# Patient Record
Sex: Male | Born: 2000 | Race: Black or African American | Hispanic: Refuse to answer | Marital: Single
Health system: Midwestern US, Community
[De-identification: ages and names within clinical notes are randomized; demographics above are authoritative.]

## PROBLEM LIST (undated history)

## (undated) DIAGNOSIS — M542 Cervicalgia: Principal | ICD-10-CM

---
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
## 2022-11-12 ENCOUNTER — Emergency Department: Admit: 2022-11-12 | Payer: MEDICAID

## 2022-11-12 ENCOUNTER — Inpatient Hospital Stay: Admit: 2022-11-12 | Discharge: 2022-11-12 | Disposition: A | Discharge status: Home / Self Care | Payer: MEDICAID

## 2022-11-12 DIAGNOSIS — M25552 Pain in left hip: Secondary | ICD-10-CM

## 2022-11-12 MED ORDER — ACETAMINOPHEN 500 MG PO TABS
500 | ORAL_TABLET | Freq: Four times a day (QID) | ORAL | 0 refills | Status: AC | PRN
Start: 2022-11-12 — End: 2022-11-19

## 2022-11-12 NOTE — ED Triage Notes (Addendum)
Pt in ED with c/o left hip pain onset 4-5 days ago. No injury noted

## 2022-11-12 NOTE — Discharge Instructions (Addendum)
Take medication as prescribed. Follow-up with your primary care physician within 2 days for reassessment. Bring the results from this visit with you for their review. Return to the ED immediately for any new, worsening, or persistent symptoms.

## 2022-11-12 NOTE — ED Provider Notes (Signed)
EMERGENCY DEPARTMENT HISTORY AND PHYSICAL EXAM    12:36 PM      Date: 11/12/2022  Patient Name: Nathan Reese    History of Presenting Illness     Chief Complaint   Patient presents with    Hip Pain         History Provided By: Patient    Additional History (Context): Nathan Reese is a 22 y.o. male with No past medical history on file.} who presents with complaint of left lateral hip pain x 4-5 days. Pt notes the pain is worse with palpation and movement. No medication PTA.  Patient denies fever or chills, swelling or discoloration, fall or trauma, numbness or tingling.    PCP: No primary care provider on file.    No current facility-administered medications for this encounter.     Current Outpatient Medications   Medication Sig Dispense Refill    acetaminophen (TYLENOL) 500 MG tablet Take 2 tablets by mouth every 6 hours as needed for Pain 56 tablet 0       Past History     Past Medical History:  No past medical history on file.    Past Surgical History:  No past surgical history on file.    Family History:  No family history on file.    Social History:       Allergies:  No Known Allergies      Review of Systems       Review of Systems   Constitutional:  Negative for chills and fever.   Respiratory:  Negative for shortness of breath.    Cardiovascular:  Negative for chest pain.   Gastrointestinal:  Negative for abdominal pain, diarrhea, nausea and vomiting.   Musculoskeletal:  Positive for arthralgias and myalgias.   Skin:  Negative for rash.   All other systems reviewed and are negative.        Physical Exam   BP 126/65   Pulse 70   Temp 98 F (36.7 C) (Oral)   Resp 18   Ht 1.727 m (5\' 8" )   Wt 59 kg (130 lb)   SpO2 99%   BMI 19.77 kg/m       Physical Exam  Vitals and nursing note reviewed.   Constitutional:       General: He is not in acute distress.     Appearance: Normal appearance. He is not ill-appearing, toxic-appearing or diaphoretic.   HENT:      Head: Normocephalic and atraumatic.    Cardiovascular:      Rate and Rhythm: Normal rate and regular rhythm.   Pulmonary:      Effort: Pulmonary effort is normal.      Breath sounds: Normal breath sounds.   Musculoskeletal:         General: Normal range of motion.      Cervical back: Normal range of motion and neck supple.      Left hip: Bony tenderness (left lateral hip TTP) present.      Comments: No erythema/edema/discoloration, full ROM and strength of hip, ambulatory with steady gait    Skin:     General: Skin is warm.      Findings: No rash.   Neurological:      General: No focal deficit present.      Mental Status: He is alert and oriented to person, place, and time.      Cranial Nerves: No cranial nerve deficit.      Sensory: No sensory deficit.  Motor: No weakness.           Diagnostic Study Results     Labs -  No results found for this or any previous visit (from the past 12 hour(s)).    Radiologic Studies -   XR HIP 2-3 VW W PELVIS LEFT   Final Result   No diagnostic abnormality.            Medical Decision Making   I am the first provider for this patient.    I reviewed the vital signs, available nursing notes, past medical history, past surgical history, family history and social history.    Vital Signs-Reviewed the patient's vital signs.    Records Reviewed: Nursing Notes and Old Medical Records (Time of Review: 12:36 PM)    ED Course: Progress Notes, Reevaluation, and Consults:  12:08 PM:  Reviewed results and plan with patient. Discussed need for close outpatient follow-up this week for reassessment. Discussed strict return precautions, including leg swelling, discoloration, or any other medical concerns. Pt in agreement with plan.     Provider Notes (Medical Decision Making): 22 year old male who presents the ED due to left lateral hip pain x 4 to 5 days.  Patient is afebrile, nontoxic-appearing, looks well.  No evidence of cellulitis, septic joint, trauma.  Ambulatory with steady gait.  Do not feel further labs or imaging are  warranted in the ED today.  Patient stable for discharge with symptomatic management and close outpatient follow-up for further assessment.  Strict return precautions provided      Diagnosis     Clinical Impression:   1. Left hip pain        Disposition: home      [facility] EMERGENCY DEPT  [address]  [place] [address]  [phone]    If symptoms worsen    [facility] MEDICINE  [address]  [place] [address]  [phone]  Schedule an appointment as soon as possible for a visit             Medication List        START taking these medications      acetaminophen 500 MG tablet  Commonly known as: TYLENOL  Take 2 tablets by mouth every 6 hours as needed for Pain               Where to Get Your Medications        These medications were sent to [facility] [address], [place] - [address] - [place] [phone] Carmon Ginsberg [phone]  [address] [place] [address]      Phone: [phone]   acetaminophen 500 MG tablet          Dictation disclaimer:  Please note that this dictation was completed with Dragon, the computer voice recognition software.  Quite often unanticipated grammatical, syntax, homophones, and other interpretive errors are inadvertently transcribed by the computer software.  Please disregard these errors.  Please excuse any errors that have escaped final proofreading.         Silvio Pate, [place]  11/12/22 [phone]

## 2024-03-08 ENCOUNTER — Inpatient Hospital Stay: Admit: 2024-03-08 | Payer: Medicaid (Managed Care)

## 2024-03-08 DIAGNOSIS — M549 Dorsalgia, unspecified: Principal | ICD-10-CM

## 2024-03-08 NOTE — Progress Notes (Signed)
 [facility] AT [facility]  [address] [place], [place] [address] [phone] Fx: [phone]  Plan of Care / Statement of Necessity for Physical Therapy Services     Patient Name: Nathan Reese DOB: [birth year]   Medical   Diagnosis: Upper back pain  Cervicalgia Treatment Diagnosis: M54.2  NECK PAIN and M54.6  THORACIC PAIN   Onset Date: 2024 Payor: [place] / Plan: ANTHEM [place] / Product Type: *No Product type* /    Referral Source: Orville Ollis ONEIDA * Start of Care [facility]): 03/08/2024   Prior Hospitalization: See medical history Provider #: [phone]   Prior Level of Function: Independent and pain free   Comorbidities: Social determinants of health: Depression   Medications: Verified on Patient Summary List     Assessment / key information:  Patient is a 23 y.o. male presenting with CC chronic neck and thoracic pain on the left side. Symptoms are insidious onset in nature x1 year, worsening in the past 2 months. Patient states it may be due to his times fishing and fighting to catch a particularly larger fish. He now has difficulty sitting/being still for prolonged periods of time, especially when using his computer. He also reports sleep disturbance due to neck pain, as well as pain/discomfort when driving. Objective measures are as follows:      Cervical ROM Loss Degrees   Flexion 40   Extension 35   Lateral Flexion R 30   Lateral Flexion L 30   Rotation R 75   Rotation L 57      Pt  will benefit from physical therapist management to address his impairments (listed below),  educate him, and improve his level of function. Thanks for your referral.    Evaluation Complexity:  History:  MEDIUM  Complexity : 1-2 comorbidities / personal factors will impact the outcome/ POC ; Examination:  LOW Complexity : 1-2 Standardized tests and measures addressing body structure, function, activity limitation and / or participation in  recreation  ;Presentation:  LOW Complexity : Stable, uncomplicated  ; Clinical Decision Making: Neck Disability Index (NDI) = 34 % ; (30% - 48% Moderate Disability) = MODERATE Complexity  Overall Complexity Rating: LOW   Problem List: pain affecting function, decrease ROM, decrease ADL/functional abilities, decrease activity tolerance, decrease flexibility/joint mobility, and decrease transfer abilities    Treatment Plan may include any combination of the following: 02889 Therapeutic Exercise, 97112 Neuromuscular Re-Education, 97140 Manual Therapy, 97530 Therapeutic Activity, 97535 Self Care/Home Management, 97014 Electrical Stim unattended / [id number] [facility]), and [id number] Mechanical Traction  Patient / Family readiness to learn indicated by: asking questions, trying to perform skills, interest, return verbalization , and return demonstration   Persons(s) to be included in education: patient (P)  Barriers to Learning/Limitations: none  Measures taken if barriers to learning present: n/a  Patient Goal (s): Less pain  Patient Self Reported Health Status: fair  Rehabilitation Potential: excellent    Short Term Goals: To be accomplished in 4 treatments  Patient to be adherent to HEP to facilitate pain control with ADL's.  -Status at IE- HEP initiated.  2.   Patient to demonstrate cervcal AROM rotation left to > 65 degrees to facilitate ease and safety in driving.  -Status at IE- C/S AROM rotation left = 57 degrees with pain.   3.   Patient to report > 70% improvement in sleep interrupted by neck pain.  -Status at IE- sleep disturbance due  to neck pain.     Long Term Goals: To be accomplished in 8 treatments  Patient to be Safe and Independent with HEP to self-manage/prevent symptoms after DC.  Patient to improve NDI score to < 24% indicate improved functional status and independence.  -Status at IE- NDI score = 34%  3.  Patient to report > 70% improvement in tolerance towards using his computer  -Status at IE- symptoms  worsened by use of computer.     Frequency / Duration: Patient to be seen 1-2 times per week for 8 treatments . Goals will be assigned and reassessed every 10 visits/ 30 days per clinic guidelines.    Patient/ Caregiver education and instruction: Diagnosis, prognosis, activity modification and exercises [x]   Plan of care has been reviewed with PTA.We reviewed our facility's Patient Personal Responsibilities (PPR) form, particularly in regards to compliance towards his appointment time, our attendance policy, and his home exercise program. Patient was informed of possible discharge for non-compliance to our attendance policy per PPR form.We also discussed his POC as deemed appropriate by the treating therapist and physician. Patient verbalized understanding that he must show objective and functional improvement in an appropriate time frame. Patient verbalized understanding that should progress or compliance be lacking, we will contact the referring physician for further consultation to address and attempt to establish alternate treatment strategies as necessary and/or possibly discharge.    Certification Period: 03/08/2024 - 05/07/24  use for Medicare Cert.or Medicaid (primary or secondary) tracking    Kellis Mcadam BJ Keath Matera, DPT, Cert. MDT, Cert. DN, Cert. SMT, Dip. Osteopractic       03/08/2024       1:51 PM  ===================================================================  I certify that the above Therapy Services are being furnished while the patient is under my care. I agree with the treatment plan and certify that this therapy is necessary.    Physician's Signature:_________________________   DATE:_________   TIME:________                           Orville Ollis DASEN, *    ** Signature, Date and Time must be completed for valid certification **  Please sign and return to [facility] or you may fax the signed copy to [phone].  Thank you.

## 2024-03-08 NOTE — Progress Notes (Signed)
 PT DAILY TREATMENT/CERVICAL EVALUATION    Patient Name: Nathan Reese    Date: 03/08/2024    DOB: 08/27/2000  Insurance: Pay[place]: [place] / Plan: ANTHEM [place] [place]ERS [place] / Product Type: *No Product type* /      Patient DOB verified yes     Visit #   Current / Total 1 8   Time   In / Out 150 243   Pain   In / Out 6 3   Subjective Functional Status/Changes: See Below   Changes to:  Meds, Allergies, Med Hx, Sx Hx?  If yes, update Summary List no  - See Chart     Treatment Area: Dorsalgia, unspecified [M54.9]    SUBJECTIVE  Mechanism of Injury/Symptoms at Onset: []  Neck []  Arm []  Forearm [x]  Thoracic   Initially started as left periscapular/T/S pain. C/S and shoulder started hurting about 6 months ago. Worsened further 2 months ago. May be due to increased activities (fishing).     Current symptoms/Complaints: Right C/S and upper trap tension/soreness, pressure to T/S   [] Improving since onset  [x] Worsening since onset  [] No change since onset    Constant Symptoms: []  Neck []  Arm ([]  L[]  R) []  Forearm ([]  L[]  R)  []  Head   -Pertinent Details: throb in upper back  Intermittent Symptoms: [x]  Neck []  Arm([]  L[]  R)  []  Forearm([]  L[]  R)  []  Head   -Pertinent Details:C/S pain, especially when looking towards the right.     Symptoms:  Aggravated by:   [x]  Bending []  Sitting [x]  Turning right  [x]  Tilting R  []  Reaching     []  Lifting   []  Carrying   []  Moving [x]  When Still  []  Cough []  Sneeze []  Valsalva         []  AM  []  As the Day Progresses      []  PM  Lying:  []  sup   []  pro   []  sidelying   [x]  Other: massage gun worsened     Eased by:    []  Bending []  Sitting []  Turning []  Tilting         []  Reaching  []  When Still  []  Moving []  AM  []  As the Day Progresses   []  PM Lying: []  sup  []  pro  []  sidelying   [x]  Other: naproxen, rubbing neck    Continued use makes the pain:  []  Better [x]   Worse []   No effect     Pain at rest: []  No    []  Yes       Disturbed night?: []  No    [x]  Yes    Sleep Position: side  sleeper, now unable to sleep on right.  Pillows: 2 under head    PMHx/Surgical Hx/Spinal Hx: unremarkable  []  Cancer  []  Trauma   []  Recent/Relevant Surgery    Previous Treatment/Compliance: unremarkable    Diagnostic Tests: []  Lab work []  X-rays    []  CT []  MRI     []  Other:  Results: no testing    Work Demands Hx: sitting    Living Situation/Domestic Life: lives with family    Mobility: independent    Self Care: independent    Leisure Activity/Recreational Limitations: exercise, computer gaming, 2 monitors. Spends 5+ hours at a time.   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 []  Yes    []  No  []  Yes []  No Recent weight change (If yes, due to dieting? []  Yes  []  No)   []  Yes []  No Persistent cough  []  Yes []  No Unremitting pain at night  []  Yes []  No Dizziness  []  Yes []  No Blurred vision  []  Yes []  No Hands more cold or painful in cold weather  []  Yes []  No Ringing in ears  []  Yes []  No Difficulty swallowing  []  Yes []  No Dysfunction of bowel or bladder  []  Yes []  No Recent illness within past 3 weeks (i.e, cold, flu)  []  Yes []  No Jaw pain    OBJECTIVE/EXAMINATION    [x]   Patient Education billed concurrently with other procedures       Posture: []  WNL  Head Position:  Shoulder/Scapular Position:  C-Kyphosis:  []  increased   []  decreased   C-Lordosis:   []  increased   []  decreased  T-Kyphosis:  []  increased   []  decreased  T-Lordosis:   []  increased   []  decreased     TMJ: []  N/A []  Abnormal - ROM:   Palpation:    Shoulder/Scapular Screen: []  WNL    []  Abnormal:    Active Movements: []  N/A   []  Too acute   []  Other:  Cervical ROM Loss Degrees Symptoms   Protrusion     Flexion 40    Retraction     Extension 35    Lateral Flexion R 30     Lateral Flexion L 30    Rotation R 75    Rotation L 57 Left C/S     Repeated Movements   Effects on present pain: produces (PR), abolishes (A), increases (incr), decreases (decr), centralizes (C), peripheral (PH), no effect (NE), Lateral Flexion (LF), Rotation (ROT)  Repeated (Rep), Retraction (RET), Protrusion (PRO), Extension (EXT)   Pre-Test Sx Symptoms During Symptoms After Testing Increase   ROM Decrease ROM No Effect   SITTING         PRO         Rep PRO         RET C/S  Left Shoulder  Left T/S Decrease Decreased left UT/Shoulder and periscapular region      Rep RET  Decrease Decreased left UT/Shoulder and periscapular region      RET EXT         Rep RET EXT           Thoracic Spine: []  N/A    []  WNL   []  Other:    Palpation:  []  Min  [x]  Mod  []  Severe    Location: left C/S  []  Min  []  Mod  []  Severe    Location: Left UT  []  Min  []  Mod  []  Severe    Location: Left T/S    Neurological (upper):   Myotome Level Muscle Test Nerve Reflex Sensation   C5 Deltoid (C5&C6) Axillary N/A Acromion to Lateral Epicondyle    Biceps (C5&C6) Musculocutaneous Biceps Tendon Purest patch at the axillary nerve at the middle deltoid    Rhomboid C5 Dorsal Scapular      Supraspinatus & Infraspinatus (C5&C6) Suprascapular      Teres Minor (C5&C6) Axillary     C6 Wrist Extensor Group (C6&C7) Radial Brachioradialis Lateral Forearm and the 6 with the fingers  ECRL/ECRB Radial      Supinator Deep Branch Radial or PIN      ECU (C6&C7) Posterior Interosseus     C7 Triceps (C7&C8) Radial Triceps Middle Finger    Extensor Indicis C6/7/8 Posterior Interosseus      FCR C7 Median     C8 Abductor Policis Brevis (C8&T1) Median Nerve Recurrent Branch N/A Medial Forearm    Flexor Digitorum Superficialis C8 Median     T1 Dorsal Interossei T1 Ulnar Nerve N/A Medial Upper Arm    Abductor Digiti Quinti T1 Ulnar Nerve       Notable Deficits from above: [x]  WNL    Upper Limb Tension Tests: []  N/A       Ulnar: [x]  R [x]  +    []  -    [x]  L [x]  +     []  -       Median: [x]  R [x]  +    []  -    [x]  L [x]  +    []  -       Radial: [x]  R [x]  +    []  -    [x]  L [x]  +    []  -    Special Tests:  Cervical:        Vertebral Artery:  []  R []  +    []  -    []  L []  +    []  -       Alar Ligament: []  R []  +    []  -    []  L []  +    []  -       Transverse Lig: []  R []  +    []  -    []  L []  +    []  -       Spurling's:  []  R []  +    []  -    [x]  L [x]  +    []  -       Distraction:  []  R []  +    []  -    []  L []  +    []  -       Compression: []  R []  +    []  -    []  L []  +    []  -  Muscle Flexibility: []  N/A   Scalenes: []  WNL: []  R    []  L    [x]  Tight: [x]  R    [x]  L   Upper Trap: []  WNL: []  R    []  L    [x]  Tight: [x]  R    [x]  L   Levator: []  WNL: []  R    []  L    [x]  Tight: [x]  R    [x]  L   Pect. Minor: []  WNL: []  R    []  L    [x]  Tight: [x]  R    [x]  L    15 min [x] Eval  - untimed                Therapeutic Procedures:  Tx Min Billable or 1:1 Min (if diff from Tx Min) Procedure, Rationale, Specifics   8  97530 Therapeutic Activity (timed):  use of dynamic activities replicating functional movements to increase ROM, strength, coordination, balance, and proprioception in order to improve patient's ability to progress to PLOF and address remaining functional goals.  (see flow sheet as applicable)     Details if applicable:     15  97112 Neuromuscular Re-Education (timed):  improve balance, coordination, kinesthetic sense, posture, core stability and  proprioception to improve patient's ability to develop conscious control of individual muscles and awareness of position of extremities in order to progress to PLOF and address remaining functional goals. (see flow sheet as applicable)     Details if applicable:     15  97535 Self Care/Home Management (timed):  improve patient knowledge and understanding of home injury/symptom/pain management, positioning, posture/ergonomics, home safety, activity modification, transfer techniques, and joint protection strategies  to improve patient's ability to  progress to PLOF and address remaining functional goals.  (see flow sheet as applicable)     Details if applicable:     38  MC BC Totals Reminder: bill using total billable min of TIMED therapeutic procedures (example: do not include dry needle or estim unattended, both untimed codes, in totals to left)  8-22 min = 1 unit; 23-37 min = 2 units; 38-52 min = 3 units; 53-67 min = 4 units; 68-82 min = 5 units   Total Total       [x]   Patient Education billed concurrently with other procedures   [x]  Review HEP    []  Progressed/Changed HEP  []  Other:    ASSESSMENT/PLAN    Patient will continue to benefit from skilled PT services to modify and progress therapeutic interventions, analyze and address functional mobility deficits, analyze and address ROM deficits, analyze and address strength deficits, analyze and address soft tissue restrictions, analyze and cue for proper movement patterns, and instruct in home and community integration to address functional deficits and attain remaining goals.    [x]   See Plan of Care - for goals and assessment     Tyjai Charbonnet BJ Tanaisha Pittman, DPT, Cert. MDT, Cert. DN, Cert. SMT, Dip. Osteopractic   03/08/2024  2:07 PM    Justification for Eval Code Complexity:  Patient History : mod  Examination see exam high  Clinical Presentation: low  Clinical Decision Making : NDI : 34%  If an interpreting service was utilized for treatment of this patient, the contents of this document represent the material reviewed with the patient via the interpreter.

## 2024-03-15 ENCOUNTER — Inpatient Hospital Stay: Payer: Medicaid (Managed Care)

## 2024-03-15 NOTE — Telephone Encounter (Signed)
 Called re: NS, pt forgot about this appt, confirmed next appt on 03/18/24, pt confirmed he will be here.

## 2024-03-18 ENCOUNTER — Inpatient Hospital Stay: Admit: 2024-03-18 | Payer: Medicaid (Managed Care)

## 2024-03-18 NOTE — Progress Notes (Signed)
 PHYSICAL THERAPY - DAILY TREATMENT NOTE (updated 1/23)    Patient Name: Nathan Reese    Date: 03/18/2024    DOB: 2000/07/11  Insurance: Pay[place] / Plan: ANTHEM [place] [place]ERS [place] / Product Type: *No Product type* /      Patient DOB verified yes     Visit #   Current / Total 2 8   Time   In / Out 118 212   Pain   In / Out 4-5 0   Subjective Functional Status/Changes: I'm doing the homework. It helps     TREATMENT AREA =  Upper back pain    Next PN due 04/07/24  Auth due 8V    OBJECTIVE    Therapeutic Procedures:  Tx Min Procedure, Rationale, Specifics   18 [id number] Therapeutic Exercise (timed):  increase ROM, strength, coordination, balance, and proprioception to improve patient's ability to progress to PLOF and address remaining functional goals. (see flow sheet as applicable)     Details if applicable:       16 97112 Neuromuscular Re-Education (timed):  improve balance, coordination, kinesthetic sense, posture, core stability and proprioception to improve patient's ability to develop conscious control of individual muscles and awareness of position of extremities in order to progress to PLOF and address remaining functional goals. (see flow sheet as applicable)     Details if applicable:     10 97140 Manual Therapy (timed):  Use of joint manipulation, joint mobilization, manual cervical traction, and soft tissue mobilization to decrease pain, increase ROM, and increase tissue extensibility to improve patient's ability to progress to PLOF and address remaining functional goals.  The manual therapy interventions were performed at a separate and distinct time from the therapeutic activities interventions.     Details if applicable:   Cervical (C2-7) B Rotatory HVLAT (Cradle Hold) in supine  Mid-Thoracic (T2-T9) PA Extension HVLAT in Prone  Extension mobilization to T/S in POE   Total  44      Heat (UNBILLED):  location/position: C/S, T/S .    Min Rationale   10 decrease pain to improve patient's  ability to progress to PLOF and address remaining functional goals.     Skin assessment post-treatment:   Intact     Charge Capture    [x]   Patient Education billed concurrently with other procedures   [x]  Review HEP    []  Progressed/Changed HEP  []  Other:    Objective Information/Functional Measures/Assessment    Initiated treatment protocol per POC.      Patient will continue to benefit from skilled PT services to modify and progress therapeutic interventions, analyze and address functional mobility deficits, analyze and address ROM deficits, analyze and address strength deficits, analyze and address soft tissue restrictions, analyze and cue for proper movement patterns, and instruct in home and community integration to address functional deficits and attain remaining goals.    Progress toward goals / Updated goals:  []   See Progress Note/Recertification    Short Term Goals: To be accomplished in 4 treatments  Patient to be adherent to HEP to facilitate pain control with ADL's.  -Status at IE- HEP initiated.  03/18/2024= compliant to HEP  2.   Patient to demonstrate cervcal AROM rotation left to > 65 degrees to facilitate ease and safety in driving.  -Status at IE- C/S AROM rotation left = 57 degrees with pain.   3.   Patient to report > 70% improvement in sleep interrupted by neck pain.  -Status  at IE- sleep disturbance due to neck pain.      Long Term Goals: To be accomplished in 8 treatments  Patient to be Safe and Independent with HEP to self-manage/prevent symptoms after DC.  Patient to improve NDI score to < 24% indicate improved functional status and independence.  -Status at IE- NDI score = 34%  3.  Patient to report > 70% improvement in tolerance towards using his computer  -Status at IE- symptoms worsened by use of computer.        PLAN  yes Continue plan of care  []   Upgrade activities as tolerated  []   Discharge: See DC Note  []   Other:    Trevious Rampey BJ Libero Puthoff, DPT, Cert. MDT, Cert. DN, Cert. SMT, Dip.  Osteopractic    03/18/2024    1:11 PM  If an interpreting service was utilized for treatment of this patient, the contents of this document represent the material reviewed with the patient via the interpreter.  Future Appointments   Date Time Provider Department Center   03/18/2024  1:20 PM Tonda Lockwood, PT [facility]   03/22/2024  1:20 PM Randell Almarie DELENA JOSETTA [facility]   03/25/2024  1:20 PM Tonda Lockwood, PT [facility]   03/29/2024  1:20 PM Randell Almarie DELENA JOSETTA [facility]   04/05/2024  1:20 PM Tonda Lockwood, PT [facility]   04/12/2024  1:20 PM Tonda Lockwood, PT [facility]   04/19/2024  1:20 PM Tonda Lockwood, PT [facility]

## 2024-03-22 ENCOUNTER — Inpatient Hospital Stay: Admit: 2024-03-22 | Payer: Medicaid (Managed Care)

## 2024-03-22 NOTE — Progress Notes (Signed)
 PHYSICAL THERAPY - DAILY TREATMENT NOTE (updated 1/23)    Patient Name: Nathan Reese    Date: 03/22/2024    DOB: 03/25/2001  Insurance: [place] / Plan: AN[place]M [place] / Product Type: *No Product type* /      Patient DOB verified yes     Visit #   Current / Total 3 8   Time   In / Out 1:20 2:00   Pain   In / Out 3/10  2/10   Subjective Functional Status/Changes: Pt reports last night his neck pain was keeping him up. It has been doing that more the last few nights. He denies any change in normal routine.     TREATMENT AREA =  Upper back pain    Next PN due 04/07/24  Medicaid Tracking due: 05/07/24  Auth due: NAR 1st 8V    OBJECTIVE    Therapeutic Procedures:  Tx Min Procedure, Rationale, Specifics   17 [id number] Therapeutic Exercise (timed):  increase ROM, strength, coordination, balance, and proprioception to improve patient's ability to progress to PLOF and address remaining functional goals. (see flow sheet as applicable)     Details if applicable:  see FS     23 97112 Neuromuscular Re-Education (timed):  improve balance, coordination, kinesthetic sense, posture, core stability and proprioception to improve patient's ability to develop conscious control of individual muscles and awareness of position of extremities in order to progress to PLOF and address remaining functional goals. (see flow sheet as applicable)     Details if applicable:  c/s retractions - multi, supine scap stabs     - 97140 Manual Therapy (timed):  Use of joint manipulation, joint mobilization, manual cervical traction, and soft tissue mobilization to decrease pain, increase ROM, and increase tissue extensibility to improve patient's ability to progress to PLOF and address remaining functional goals.  The manual therapy interventions were performed at a separate and distinct time from the therapeutic activities interventions.     Details if applicable:      Total  40      Heat (UNBILLED):  location/position: C/S,  T/S .    Min Rationale   PD decrease pain to improve patient's ability to progress to PLOF and address remaining functional goals.     Skin assessment post-treatment:   Intact     Charge Capture    [x]   Patient Education billed concurrently with other procedures   [x]  Review HEP    []  Progressed/Changed HEP  []  Other:    Objective Information/Functional Measures/Assessment  - demo and/or cuing for all therex  - fair t/s mobility with cat/cow and thread the needle  - challenge with supine scap stabs  - additional time to complete tasks    Pt is progressing slowly in therapy. He is sore today from just completing a workout but is willing to try all therex. Overall pain levels are decreasing however has has increase in pain at night the last few nights which is interrupting his sleep. Pt pleased with c/s retractions, performed in multiple positions throughout session. Pt declined modalities.     Patient will continue to benefit from skilled PT services to modify and progress therapeutic interventions, analyze and address functional mobility deficits, analyze and address ROM deficits, analyze and address strength deficits, analyze and address soft tissue restrictions, analyze and cue for proper movement patterns, and instruct in home and community integration to address functional deficits and attain remaining goals.    Progress  toward goals / Updated goals:  []   See Progress Note/Recertification    Short Term Goals: To be accomplished in 4 treatments  Patient to be adherent to HEP to facilitate pain control with ADL's.  -Status at IE- HEP initiated.  Current: compliant to HEP (03/18/24)    2.   Patient to demonstrate cervcal AROM rotation left to > 65 degrees to facilitate ease and safety in driving.  -Status at IE- C/S AROM rotation left = 57 degrees with pain.     3.   Patient to report > 70% improvement in sleep interrupted by neck pain.  -Status at IE- sleep disturbance due to neck pain.   Current: reports increase  sleep disturbance the last few nights d/t pain (03/22/24)     Long Term Goals: To be accomplished in 8 treatments  Patient to be Safe and Independent with HEP to self-manage/prevent symptoms after DC.    Patient to improve NDI score to < 24% indicate improved functional status and independence.  -Status at IE- NDI score = 34%    3.  Patient to report > 70% improvement in tolerance towards using his computer  -Status at IE- symptoms worsened by use of computer.        PLAN  yes Continue plan of care  []   Upgrade activities as tolerated  []   Discharge: See DC Note  []   Other:    Almarie Requena, LPTA    03/22/2024    9:32 AM  If an interpreting service was utilized for treatment of this patient, the contents of this document represent the material reviewed with the patient via the interpreter.  Future Appointments   Date Time Provider Department Center   03/22/2024  1:20 PM Requena Almarie DELENA JOSETTA [facility]   03/25/2024  1:20 PM Tonda Lockwood, PT [facility]   03/29/2024  1:20 PM Requena Almarie DELENA JOSETTA [facility]   04/05/2024  1:20 PM Tonda Lockwood, PT [facility]   04/12/2024  1:20 PM Tonda Lockwood, PT [facility]   04/19/2024  1:20 PM Tonda Lockwood, PT [facility]

## 2024-03-25 ENCOUNTER — Inpatient Hospital Stay: Admit: 2024-03-25 | Payer: Medicaid (Managed Care)

## 2024-03-25 DIAGNOSIS — M549 Dorsalgia, unspecified: Principal | ICD-10-CM

## 2024-03-25 NOTE — Progress Notes (Signed)
 PHYSICAL THERAPY - DAILY TREATMENT NOTE (updated 1/23)    Patient Name: Nathan Reese    Date: 03/25/2024    DOB: [birth year]  Insurance: [place] / Plan: ANTHEM [place] / Product Type: *No Product type* /      Patient DOB verified yes     Visit #   Current / Total 4 8   Time   In / Out 118 158   Pain   In / Out 1' 0   Subjective Functional Status/Changes: I'm feeling a lot better.     TREATMENT AREA =  Upper back pain    OBJECTIVE    Therapeutic Procedures:  Tx Min Billable or 1:1 Min (if diff from Tx Min) Procedure, Rationale, Specifics   12  97710 Therapeutic Exercise (timed):  increase ROM, strength, coordination, balance, and proprioception to improve patient's ability to progress to PLOF and address remaining functional goals. (see flow sheet as applicable)     Details if applicable:       12  97530 Therapeutic Activity (timed):  use of dynamic activities replicating functional movements to increase ROM, strength, coordination, balance, and proprioception in order to improve patient's ability to progress to PLOF and address remaining functional goals.  (see flow sheet as applicable)     Details if applicable:     8  97140 Manual Therapy (timed):  Use of joint manipulation, joint mobilization, manual cervical traction, and soft tissue mobilization to decrease pain, increase ROM, and increase tissue extensibility to improve patient's ability to progress to PLOF and address remaining functional goals.  The manual therapy interventions were performed at a separate and distinct time from the therapeutic activities interventions.     Details if applicable:   Cervical (C2-7)  Rotatory HVLAT (Cradle Hold) in sitting  Repeated retraction mobilization   38  MC BC Totals Reminder: bill using total billable min of TIMED therapeutic procedures (example: do not include dry needle or estim unattended, both untimed codes, in totals to left)  8-22 min = 1 unit; 23-37 min = 2 units; 38-52 min =  3 units; 53-67 min = 4 units; 68-82 min = 5 units   Total Total       Charge Capture    [x]   Patient Education billed concurrently with other procedures   [x]  Review HEP    []  Progressed/Changed HEP  []  Other:    Objective Information/Functional Measures/Assessment    Advance scap stabs to sitting on DD  Added and advanced therex per flow sheet.      Patient will continue to benefit from skilled PT services to modify and progress therapeutic interventions, analyze and address functional mobility deficits, analyze and address ROM deficits, analyze and address strength deficits, analyze and address soft tissue restrictions, analyze and cue for proper movement patterns, and instruct in home and community integration to address functional deficits and attain remaining goals.    Progress toward goals / Updated goals:  []   See Progress Note/Recertification    Short Term Goals: To be accomplished in 4 treatments  Patient to be adherent to HEP to facilitate pain control with ADL's.  -Status at IE- HEP initiated.  Current: compliant to HEP (03/18/24)     2.   Patient to demonstrate cervcal AROM rotation left to > 65 degrees to facilitate ease and safety in driving.  -Status at IE- C/S AROM rotation left = 57 degrees with pain.    03/25/2024 MET. 65 degrees  3.   Patient to report > 70% improvement in sleep interrupted by neck pain.  -Status at IE- sleep disturbance due to neck pain.   Current: reports increase sleep disturbance the last few nights d/t pain (03/22/24)     Long Term Goals: To be accomplished in 8 treatments  Patient to be Safe and Independent with HEP to self-manage/prevent symptoms after DC.     Patient to improve NDI score to < 24% indicate improved functional status and independence.  -Status at IE- NDI score = 34%     3.  Patient to report > 70% improvement in tolerance towards using his computer  -Status at IE- symptoms worsened by use of computer.     Next PN due 04/07/24  Medicaid Tracking due:  05/07/24  Auth due: NAR 1st 8V    PLAN  yes Continue plan of care  []   Upgrade activities as tolerated  []   Discharge: See DC Note  []   Other:    Deatrice LEA Medicus, DPT, Cert. MDT, Cert. DN, Cert. SMT, Dip. Osteopractic    03/25/2024    1:23 PM  If an interpreting service was utilized for treatment of this patient, the contents of this document represent the material reviewed with the patient via the interpreter.    Future Appointments   Date Time Provider Department Center   04/05/2024  1:20 PM Medicus Deatrice, PT [facility]   04/12/2024  1:20 PM Medicus Deatrice, PT [facility]   04/19/2024  1:20 PM Medicus Deatrice, PT [facility]

## 2024-03-26 ENCOUNTER — Inpatient Hospital Stay: Admit: 2024-03-26 | Payer: Medicaid (Managed Care)

## 2024-03-26 ENCOUNTER — Encounter

## 2024-03-26 DIAGNOSIS — M542 Cervicalgia: Secondary | ICD-10-CM

## 2024-04-05 ENCOUNTER — Inpatient Hospital Stay: Admit: 2024-04-05 | Payer: Medicaid (Managed Care)

## 2024-04-05 NOTE — Progress Notes (Addendum)
 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      TREATMENT AREA =  Upper back pain    OBJECTIVE    Therapeutic Procedures:  Tx Min Billable or 1:1 Min (if diff from Tx Min) Procedure, Rationale, Specifics   15 13 97710 Therapeutic Exercise (timed):  increase ROM, strength, coordination, balance, and proprioception to improve patient's ability to progress to PLOF and address remaining functional goals. (see flow sheet as applicable)     Details if applicable:       15 15 97530 Therapeutic Activity (timed):  use of dynamic activities replicating functional movements to increase ROM, strength, coordination, balance, and proprioception in order to improve patient's ability to progress to PLOF and address remaining functional goals.  (see flow sheet as applicable)     Details if applicable:  re-assess goals     10 10 97535 Self Care/Home Management (timed):  improve patient knowledge and understanding of home safety, activity modification, diagnosis/prognosis, and physical therapy expectations, procedures and progression  to improve patient's ability to progress to PLOF and address remaining functional goals.  (see flow sheet as applicable)      Details if applicable:  education on Medcline pillow for side sleeping to decreased right neck/shoulder pain d/t compression; trial and education on chirp wheel with t/s extension to improve spinal mobility       40 38 MC BC Totals Reminder: bill using total billable  min of TIMED therapeutic procedures (example: do not include dry needle or estim unattended, both untimed codes, in totals to left)  8-22 min = 1 unit; 23-37 min = 2 units; 38-52 min = 3 units; 53-67 min = 4 units; 68-82 min = 5 units   Total Total     Charge Capture    [x]   Patient Education billed concurrently with other procedures   [x]  Review HEP    []  Progressed/Changed HEP  [x]  Other: educated on looking into a Medcline pillow or something similar in his affordable price range - to aide in ability to sleep on right side w/o compressing neck and shoulder    Objective Information/Functional Measures/Assessment  SEE PROGRESS NOTE    HEP Compliance: YES  % Improvement: 50-60%  Pain range: c/s: 0-4/10, t/s: 3-8/10  Subjective Improvement: improving c/s ROM, decreasing pain, improving sleeping tolerance, improving ergonomics and tolerance to computer work  Continued Deficits: prolonged positioning with welding, sleeping tolerance (unable to sleep on right side)    MT:   Left: 4/5, Right: 4/5    LT:   Left 4/5, Right; 4/5    - pt declined modalities, will use heat at home    Patient will continue to benefit from skilled PT services to modify and progress therapeutic interventions, analyze and address functional mobility deficits, analyze and address ROM  deficits, analyze and address strength deficits, analyze and address soft tissue restrictions, analyze and cue for proper movement patterns, and instruct in home and community integration to address functional deficits and attain remaining goals.    Progress toward goals / Updated goals:  []   See Progress Note/Recertification    Short Term Goals: To be accomplished in 4 treatments  Patient to be adherent to HEP to facilitate pain control with ADL's.  -Status at IE- HEP initiated.  Current: MET: compliant to HEP     2.   Patient to demonstrate cervcal AROM rotation left to > 65 degrees to facilitate ease and safety in driving.  -Status at IE- C/S AROM rotation left =  57 degrees with pain.   Current: MET: 65 degrees    3.   Patient to report > 70% improvement in sleep interrupted by neck pain.  -Status at IE- sleep disturbance due to neck pain.   Current: Progressing: 50-60%     Long Term Goals: To be accomplished in 8 treatments  Patient to be Safe and Independent with HEP to self-manage/prevent symptoms after DC.  Current: NT     Patient to improve NDI score to < 24% indicate improved functional status and independence.  -Status at IE- NDI score = 34%  Current: MET: 12%     3.  Patient to report > 70% improvement in tolerance towards using his computer  -Status at IE- symptoms worsened by use of computer.   Current: Progressing: 70%    Next PN due 05/05/24  Medicaid Tracking due: 05/07/24  Auth due: NAR 1st 8V    PLAN  yes Continue plan of care  []   Upgrade activities as tolerated  []   Discharge: See DC Note  []   Other:    Nathan Reese, LPTA     04/05/2024    8:31 AM  If an interpreting service was utilized for treatment of this patient, the contents of this document represent the material reviewed with the patient via the interpreter.    Future Appointments   Date Time Provider Department Center   04/05/2024  4:00 PM Reese Nathan DELENA JOSETTA [facility]   04/13/2024  4:40 PM Ladora Katheran HERO, PT [facility]   04/20/2024  4:40 PM Tonda Lockwood, PT [facility]   04/22/2024  4:00 PM [facility] [address] [facility]       "

## 2024-04-05 NOTE — Progress Notes (Cosign Needed)
 "[facility] PHYSICAL THERAPY  [address] [place], [place] [address] - Ph: [phone]   Fx: [phone]  PHYSICAL THERAPY PROGRESS NOTE  [x]  Progress Note  []  Discharge Summary    Patient Name: Nathan Reese DOB: [birth year]   Treatment/Medical Diagnosis: Upper back pain   Referral Source: Orville Ollis ONEIDA, *     Date of Initial Visit: 03/08/24 Attended Visits: 5 Missed Visits: 0       Prior Level of Function: Independent and pain free   Comorbidities: Social determinants of health: Depression     SUMMARY OF TREATMENT  Pt is progressing well in therapy and has met most of his goals regarding his neck pain however reports 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 Pt with difficulty maintain prolonged positioning with c/s flexion while using UE in order to weld. He is compliant with HEP and finds it beneficial. He continues to have decreased sleeping tolerance d/t right c/s pain. He has been educated on options such as a Medcline pillow for improved positioning with side sleeping to decrease right neck/shoulder pain d/t compression; trial and education on chirp wheel with t/s extension to improve spinal mobility.     HEP Compliance: YES  % Improvement: 50-60%  Pain range: c/s: 0-4/10, t/s: 3-8/10  Subjective Improvement: improving c/s ROM, decreasing pain, improving sleeping tolerance, improving ergonomics and tolerance to computer work  Continued Deficits: prolonged positioning with welding, sleeping tolerance (unable to sleep on right side)     MT:   Left: 4/5, Right: 4/5     LT:   Left 4/5, Right; 4/5    Patient will continue to benefit from skilled PT services to modify and progress therapeutic interventions, analyze and address functional mobility deficits, analyze and address ROM deficits, analyze and address strength deficits, analyze and address soft tissue restrictions, analyze and cue for proper movement patterns, and  instruct in home and community integration to address functional deficits and attain remaining goals.     CURRENT STATUS/Progress towards Goals:  Short Term Goals: To be accomplished in 4 treatments  Patient to be adherent to HEP to facilitate pain control with ADL's.  -Status at IE- HEP initiated.  Current: MET: compliant to HEP     2.   Patient to demonstrate cervcal AROM rotation left to > 65 degrees to facilitate ease and safety in driving.  -Status at IE- C/S AROM rotation left = 57 degrees with pain.   Current: MET: 65 degrees     3.   Patient to report > 70% improvement in sleep interrupted by neck pain.  -Status at IE- sleep disturbance due to neck pain.   Current: Progressing: 50-60%     Long Term Goals: To be accomplished in 8 treatments  Patient to be Safe and Independent with HEP to self-manage/prevent symptoms after DC.  Current: NT     Patient to improve NDI score to < 24% indicate improved functional status and independence.  -Status at IE- NDI score = 34%  Current: MET: 12%     3.  Patient to report > 70% improvement in tolerance towards using his computer  -Status at IE- symptoms worsened by use of computer.   Current: Progressing: 70%      Remaining Goals Unmet:  STGs:  1.   Patient to report > 70% improvement in sleep interrupted by neck pain.  -Status at IE- sleep disturbance due to neck  pain.   Status at last progress note: Progressing: 50-60%    LTGs:  Patient to be Safe and Independent with HEP to self-manage/prevent symptoms after DC.  Status at last progress note: NT     2.  Patient to report > 70% improvement in tolerance towards using his computer  -Status at IE- symptoms worsened by use of computer.   Status at last progress note: Progressing: 70%      Non-Medicare, ADD NEW GOALS, can adjust or add frequency duration, no signature required    A Home Exercise Program (HEP) and Patient Education was given to the patient at the start of care.  This has been progressed throughout the course  of care adjusting for tolerance, strength, stability, flexibility, range of motion, and function and patient reports they are 100% compliant with HEP thus far. Once the patient has met their discharge functional goals we will discontinue care at our facility and the patient will continue the most recently updated HEP and instructions in order to maintain their function.    New Goals to be achieved in __4__ WEEKS  1. 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  Status at last progress note: 4/5    2. 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  Status at last progress note: new goal: t/s pain 3-8/10      Frequency / Duration:   Patient to be seen   1-2   times per week for   4    WEEKS    RECOMMENDATIONS  Patient would benefit from the continuation of skilled rehab interventions for functional progress to achieving above stated clinically significant goals.    If you have any questions/comments please contact us  directly.  Thank you for allowing us  to assist in the care of your patient.    Almarie DELENA Requena, PTA       04/05/2024       11:59 AM  Roya Gieselman BJ Mathew Postiglione, DPT, Cert. MDT, Cert. DN, Cert. SMT, Dip. Osteopractic  SIGNATURE NOT REQUIRED    "

## 2024-04-20 ENCOUNTER — Inpatient Hospital Stay: Admit: 2024-04-20 | Payer: Medicaid (Managed Care)

## 2024-04-20 NOTE — Progress Notes (Signed)
 "PHYSICAL THERAPY - DAILY TREATMENT NOTE (updated 1/23)    Patient Name: Nathan Reese    Date: 04/20/2024    DOB: March 21, 2001  Ins[place]: [place] / [place]: [place] / Product Type: *No Product type* /      Patient DOB verified yes     Visit #   Current / Total 6 8   Time   In / Out 440 525   Pain   In / Out 3 0   Subjective Functional Status/Changes: I'm a little tight. [place] [employer] and I've been having to wear the helmet.      TREATMENT AREA =  Upper back pain    OBJECTIVE    Therapeutic Procedures:  Tx Min Procedure, Rationale, Specifics   15 [id number] Therapeutic Exercise (timed):  increase ROM, strength, coordination, balance, and proprioception to improve patient's ability to progress to PLOF and address remaining functional goals. (see flow sheet as applicable)     Details if applicable:       15 97530 Therapeutic Activity (timed):  use of dynamic activities replicating functional movements to increase ROM, strength, coordination, balance, and proprioception in order to improve patient's ability to progress to PLOF and address remaining functional goals.  (see flow sheet as applicable)     Details if applicable:     15 97112 Neuromuscular Re-Education (timed):  improve balance, coordination, kinesthetic sense, posture, core stability and proprioception to improve patient's ability to develop conscious control of individual muscles and awareness of position of extremities in order to progress to PLOF and address remaining functional goals. (see flow sheet as applicable)     Details if applicable:  Steel Mace Uppercut     8 [id number] Manual Therapy (timed):  decrease pain, increase ROM, and increase tissue extensibility to improve patient's ability to progress to PLOF and address remaining functional goals.  The manual therapy interventions were performed at a separate and distinct time from the therapeutic activities interventions . (see flow sheet as  applicable)     Details if applicable: Cervical (C2-7) B Rotatory HVLAT (Cradle Hold) in supine  Supine repeated retraction    53 [facility] BC Totals Reminder: bill using total billable min of TIMED therapeutic procedures (example: do not include dry needle or estim unattended, both untimed codes, in totals to left)  8-22 min = 1 unit; 23-37 min = 2 units; 38-52 min = 3 units; 53-67 min = 4 units; 68-82 min = 5 units   Total       Charge Capture    [x]   Patient Education billed concurrently with other procedures   [x]  Review HEP    []  Progressed/Changed HEP  []  Other:    Objective Information/Functional Measures/Assessment    Educated patient on how to use chirp wheel and thera-cane    Patient will continue to benefit from skilled PT services to modify and progress therapeutic interventions, analyze and address functional mobility deficits, analyze and address ROM deficits, analyze and address strength deficits, analyze and address soft tissue restrictions, analyze and cue for proper movement patterns, and instruct in home and community integration to address functional deficits and attain remaining goals.    Progress toward goals / Updated goals:  []   See Progress Note/Recertification    1.   Patient to report > 70% improvement in sleep interrupted by neck pain.  -Status at IE- sleep disturbance due to neck pain.   Status at last  progress note: Progressing: 50-60%  04/20/2024 MET  2.Patient to be Safe and Independent with HEP to self-manage/prevent symptoms after DC.  Status at last progress note: NT     3. Patient to report > 70% improvement in tolerance towards using his computer  -Status at IE- symptoms worsened by use of computer.   Status at last progress note: Progressing: 70%  04/20/2024 MET    4. 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  Status at last progress note: 4/5     5. 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  Status at last progress note: new goal: t/s pain 3-8/10    Next PN due 05/05/24  Auth due 8v    PLAN  yes Continue plan of care  []   Upgrade activities as tolerated  []   Discharge: See DC Note  []   Other:    Katiya Fike B[employer] [place], DPT, Cert. MDT, Cert. DN, Cert. SMT, Dip. Osteopractic    04/20/2024    4:51 PM  If an interpreting service was utilized for treatment of this patient, the contents of this document represent the material reviewed with the patient via the interpreter.    Future Appointments   Date Time Provider Department Center   04/22/2024  4:00 PM [facility] PT [address] [facility]       "

## 2024-04-22 ENCOUNTER — Inpatient Hospital Stay: Payer: Medicaid (Managed Care)

## 2024-04-26 ENCOUNTER — Inpatient Hospital Stay: Admit: 2024-04-26 | Payer: Medicaid (Managed Care)

## 2024-04-26 DIAGNOSIS — M549 Dorsalgia, unspecified: Principal | ICD-10-CM

## 2024-04-26 NOTE — Progress Notes (Signed)
 "PHYSICAL THERAPY - DAILY TREATMENT NOTE (updated 1/23)    Patient Name: Nathan Reese    Date: 04/26/2024    DOB: 03-13-2001  Insurance: [place] / Plan: AN[place]M [employer] [place] / Product Type: *No Product type* /      Patient DOB verified yes     Visit #   Current / Total 7 8   Time   In / Out 320 400   Pain   In / Out 3-4 0   Subjective Functional Status/Changes: It's feeling irritated. I've been sleeping at a friend's house on their couch for the past 5 days. Just chillin' over there.      TREATMENT AREA =  Upper back pain    OBJECTIVE    Therapeutic Procedures:  Tx Min Billable or 1:1 Min (if diff from Tx Min) Procedure, Rationale, Specifics   16  97710 Therapeutic Exercise (timed):  increase ROM, strength, coordination, balance, and proprioception to improve patient's ability to progress to PLOF and address remaining functional goals. (see flow sheet as applicable)     Details if applicable:       16  97530 Therapeutic Activity (timed):  use of dynamic activities replicating functional movements to increase ROM, strength, coordination, balance, and proprioception in order to improve patient's ability to progress to PLOF and address remaining functional goals.  (see flow sheet as applicable)     Details if applicable:    [employer] Mace [place]   8  [id number] Neuromuscular Re-Education (timed):  improve balance, coordination, kinesthetic sense, posture, core stability and proprioception to improve patient's ability to develop conscious control of individual muscles and awareness of position of extremities in order to progress to PLOF and address remaining functional goals. (see flow sheet as applicable)     Details if applicable:       [facility] BC Totals Reminder: bill using total billable min of TIMED therapeutic procedures (example: do not include dry needle or estim unattended, both untimed codes, in totals to left)  8-22 min = 1 unit; 23-37 min = 2  units; 38-52 min = 3 units; 53-67 min = 4 units; 68-82 min = 5 units   Total Total       Charge Capture    [x]   Patient Education billed concurrently with other procedures   [x]  Review HEP    []  Progressed/Changed HEP  []  Other:    Objective Information/Functional Measures/Assessment    Added and advanced therex per flow sheet.  Responded well to treatment as patient reported progressive reduction in symptoms throughout session      Patient will continue to benefit from skilled PT services to modify and progress therapeutic interventions, analyze and address functional mobility deficits, analyze and address ROM deficits, analyze and address strength deficits, analyze and address soft tissue restrictions, analyze and cue for proper movement patterns, and instruct in home and community integration to address functional deficits and attain remaining goals.    Progress toward goals / Updated goals:  []   See Progress Note/Recertification  1.   Patient to report > 70% improvement in sleep interrupted by neck pain.  -Status at IE- sleep disturbance due to neck pain.   Status at last progress note: Progressing: 50-60%  04/20/2024 MET  2.Patient to be Safe and Independent with HEP to self-manage/prevent symptoms after DC.  Status at last progress note: NT     3. Patient to report >  70% improvement in tolerance towards using his computer  -Status at IE- symptoms worsened by use of computer.   Status at last progress note: Progressing: 70%  04/20/2024 MET     4. 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  Status at last progress note: 4/5  04/26/2024 MET   5. 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  Status at last progress note: new goal: t/s pain 3-8/10     Next PN due 05/05/24  Auth due 8v    PLAN  yes Continue plan of care  []   Upgrade activities as tolerated  []   Discharge: See DC  Note  []   Other:    Sailor Haughn B[employer] Tracer [place], DPT, Cert. MDT, Cert. DN, Cert. SMT, Dip. Osteopractic    04/26/2024    3:25 PM  If an interpreting service was utilized for treatment of this patient, the contents of this document represent the material reviewed with the patient via the interpreter.    No future appointments.    "

## 2024-05-04 ENCOUNTER — Inpatient Hospital Stay: Admit: 2024-05-04 | Payer: Medicaid (Managed Care)

## 2024-05-04 NOTE — Progress Notes (Addendum)
"  PHYSICAL THERAPY - DAILY TREATMENT NOTE (updated 1/23)    Patient Name: Nathan Reese    Date: 05/04/2024    DOB: 11-Dec-2000  Insurance: [place] / Plan: ANTHEM [employer] [place] / Product Type: *No Product type* /      Patient DOB verified yes     Visit #   Current / Total 8 8   Time   In / Out 400 440   Pain   In / Out 0 0   Subjective Functional Status/Changes: I started night shift. I've been pretty good     TREATMENT AREA =  Upper back pain    OBJECTIVE    Therapeutic Procedures:  Tx Min Billable or 1:1 Min (if diff from Tx Min) Procedure, Rationale, Specifics   10  97710 Therapeutic Exercise (timed):  increase ROM, strength, coordination, balance, and proprioception to improve patient's ability to progress to PLOF and address remaining functional goals. (see flow sheet as applicable)     Details if applicable:       15  97530 Therapeutic Activity (timed):  use of dynamic activities replicating functional movements to increase ROM, strength, coordination, balance, and proprioception in order to improve patient's ability to progress to PLOF and address remaining functional goals.  (see flow sheet as applicable)     Details if applicable:    [employer] Club:   -[place]over  -Inside Circles  -Outside Circles  -Steel Mace uppercut   10  97535 Self Care/Home Management (timed):  improve patient knowledge and understanding of home injury/symptom/pain management, positioning, posture/ergonomics, home safety, activity modification, transfer techniques, and joint protection strategies  to improve patient's ability to progress to PLOF and address remaining functional goals.  (see flow sheet as applicable)     Details if applicable:     40  [facility] BC Totals Reminder: bill using total billable min of TIMED therapeutic procedures (example: do not include dry needle or estim unattended, both untimed codes, in totals to left)  8-22 min = 1 unit; 23-37 min = 2 units; 38-52 min = 3 units; 53-67 min = 4 units;  68-82 min = 5 units   Total Total       Charge Capture    [x]   Patient Education billed concurrently with other procedures   [x]  Review HEP    []  Progressed/Changed HEP  []  Other:    Objective Information/Functional Measures/Assessment    See DC  Progress toward goals / Updated goals:  [x]   See Progress Note/Recertification    Next PN due today      PLAN  no Continue plan of care  []   Upgrade activities as tolerated  []   Discharge: See DC Note  []   Other:    Melvinia Ashby BJ Kirston Luty, DPT, Cert. MDT, Cert. DN, Cert. SMT, Dip. Osteopractic    05/04/2024    4:38 PM  If an interpreting service was utilized for treatment of this patient, the contents of this document represent the material reviewed with the patient via the interpreter.    No future appointments.    "

## 2024-05-04 NOTE — Progress Notes (Signed)
"  [facility]    [address]  [place], [place] [address]  [phone]  [phone] fax    Continue Home Exercise Program 1-3 times per day for 4 weeks, then decrease to 3-5 times per week      Continue with    [x]  Ice  as needed or 2-3 times per day     [x]  Heat           Follow up with MD:     []  Upon completion of therapy     [x]  As needed      Recommendations:     []    Return to activity with home program    [x]    Return to activity with the following modifications:       [] Post Rehab Program    [] Join Independent aquatic program     [x] Return to/join local gym    Nathan Reese BJ Taijon Vink, DPT, Cert. MDT, Cert. DN, Cert. SMT, Dip. Osteopractic     Date/Time: 05/04/2024 4:40      "

## 2024-05-04 NOTE — Therapy Discharge (Signed)
"  [facility] PHYSICAL THERAPY  [address] [place], [place] [address] [phone] Fx: [phone]  DISCHARGE SUMMARY  Patient Name: Nathan Reese DOB: 06/03/2001   Treatment/Medical Diagnosis: Upper back pain   Referral Source: Orville Ollis ONEIDA, *     Date of Initial Visit: 03/08/24 Attended Visits: 8 Missed Visits: 3     SUMMARY OF TREATMENT  Patient's POC has consisted of therex, therapeutic activities, manual therapy prn, modalities prn, pt. education, and a comprehensive HEP. Treatment strategies used to address functional mobility deficits, ROM deficits, strength deficits, analyze and address soft tissue restrictions, analyze and cue movement patterns, analyze and modify body mechanics/ergonomics, assess and modify postural abnormalities and instruct in home and community integration.   CURRENT STATUS  Patient made excellent progress despite missed sessions due to changes in work schedule and having to sleep on a couch at a friend's house for 5 days. He has now started taking classes again and working night shift at the [employer]. He denies any limitations to his work duties or recreational activities, and is able to address any symptoms recurrence with his HEP. Cervical AROM has been improved as follows:  Cervical ROM Loss Degrees  EVAL Degrees  05/04/24   Flexion 40 60   Extension 35 57   Lateral Flexion R 30 50   Lateral Flexion L 30 50   Rotation R 75 78   Rotation L 57 75     GOALS/MEASURE OF PROGRESS Goal Met?   1.   Patient to report > 70% improvement in sleep interrupted by neck pain.  -Status at IE- sleep disturbance due to neck pain.   Status at last progress note:  50-60%    2.  Patient to be Safe and Independent with HEP to self-manage/prevent symptoms after DC.  Status at last progress note: NT     2=3.  Patient to report > 70% improvement in tolerance towards using his computer  -Status at IE- symptoms worsened by use of computer.   Status at last  progress note: Progressing: 70%    MET            MET          MET     RECOMMENDATIONS  Patient has met all clinical & personal goals and is now ready to be DC'd with a comprehensive HEP.     If you have any questions/comments please contact us  directly at [phone].   Thank you for allowing us  to assist in the care of your patient.  Therapist Signature: Deatrice LEA Medicus, DPT, Cert. MDT, Cert. DN, Cert. SMT, Dip. Osteopractic Date: 05/04/24   Reporting Period:  Certification Period: 04/05/24-05/04/24  N/a Time: 8:20 AM     "
